# Patient Record
Sex: Male | Born: 1980 | Race: Black or African American | Hispanic: No | Smoking: Current every day smoker
Health system: Southern US, Community
[De-identification: ages and names within clinical notes are randomized; demographics above are authoritative.]

---
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
## 2000-04-16 ENCOUNTER — Emergency Department (HOSPITAL_COMMUNITY): Admission: EM | Admit: 2000-04-16 | Discharge: 2000-04-16 | Payer: Self-pay | Admitting: Emergency Medicine

## 2000-04-16 ENCOUNTER — Encounter: Payer: Self-pay | Admitting: Emergency Medicine

## 2004-09-07 ENCOUNTER — Emergency Department (HOSPITAL_COMMUNITY): Admission: EM | Admit: 2004-09-07 | Discharge: 2004-09-08 | Payer: Self-pay | Admitting: Emergency Medicine

## 2004-12-16 ENCOUNTER — Emergency Department: Payer: Self-pay | Admitting: Emergency Medicine

## 2005-04-18 ENCOUNTER — Ambulatory Visit (HOSPITAL_BASED_OUTPATIENT_CLINIC_OR_DEPARTMENT_OTHER): Admission: RE | Admit: 2005-04-18 | Discharge: 2005-04-18 | Payer: Self-pay | Admitting: Orthopedic Surgery

## 2006-04-29 ENCOUNTER — Emergency Department (HOSPITAL_COMMUNITY): Admission: EM | Admit: 2006-04-29 | Discharge: 2006-04-29 | Payer: Self-pay | Admitting: Emergency Medicine

## 2015-06-08 ENCOUNTER — Encounter (HOSPITAL_COMMUNITY): Payer: Self-pay | Admitting: *Deleted

## 2015-06-08 ENCOUNTER — Emergency Department (HOSPITAL_COMMUNITY): Payer: Self-pay

## 2015-06-08 ENCOUNTER — Emergency Department (HOSPITAL_COMMUNITY)
Admission: EM | Admit: 2015-06-08 | Discharge: 2015-06-08 | Disposition: A | Discharge status: Home / Self Care | Payer: Self-pay | Attending: Emergency Medicine | Admitting: Emergency Medicine

## 2015-06-08 DIAGNOSIS — S43004A Unspecified dislocation of right shoulder joint, initial encounter: Secondary | ICD-10-CM

## 2015-06-08 DIAGNOSIS — Y998 Other external cause status: Secondary | ICD-10-CM | POA: Insufficient documentation

## 2015-06-08 DIAGNOSIS — F17200 Nicotine dependence, unspecified, uncomplicated: Secondary | ICD-10-CM | POA: Insufficient documentation

## 2015-06-08 DIAGNOSIS — Y9389 Activity, other specified: Secondary | ICD-10-CM | POA: Insufficient documentation

## 2015-06-08 DIAGNOSIS — X58XXXA Exposure to other specified factors, initial encounter: Secondary | ICD-10-CM | POA: Insufficient documentation

## 2015-06-08 DIAGNOSIS — Y9289 Other specified places as the place of occurrence of the external cause: Secondary | ICD-10-CM | POA: Insufficient documentation

## 2015-06-08 DIAGNOSIS — S43014A Anterior dislocation of right humerus, initial encounter: Secondary | ICD-10-CM | POA: Insufficient documentation

## 2015-06-08 MED ORDER — FLUMAZENIL 0.5 MG/5ML IV SOLN
INTRAVENOUS | Status: AC
  Filled 2015-06-08: qty 5

## 2015-06-08 MED ORDER — CYCLOBENZAPRINE HCL 10 MG PO TABS: 10.0000 mg | ORAL_TABLET | Freq: Two times a day (BID) | ORAL | Status: AC | PRN

## 2015-06-08 MED ORDER — PROPOFOL 10 MG/ML IV BOLUS
100.0000 mg | Freq: Once | INTRAVENOUS | Status: AC
  Administered 2015-06-08: 100 mg via INTRAVENOUS

## 2015-06-08 MED ORDER — SODIUM CHLORIDE 0.9 % IV BOLUS (SEPSIS)
1000.0000 mL | Freq: Once | INTRAVENOUS | Status: AC
  Administered 2015-06-08: 1000 mL via INTRAVENOUS

## 2015-06-08 MED ORDER — PROPOFOL 10 MG/ML IV BOLUS
1.0000 mg/kg | Freq: Once | INTRAVENOUS | Status: DC
  Filled 2015-06-08: qty 20

## 2015-06-08 MED ORDER — FENTANYL CITRATE (PF) 100 MCG/2ML IJ SOLN
100.0000 ug | Freq: Once | INTRAMUSCULAR | Status: AC
Start: 2015-06-08 — End: 2015-06-08
  Administered 2015-06-08: 100 ug via INTRAVENOUS
  Filled 2015-06-08: qty 2

## 2015-06-08 MED ORDER — NALOXONE HCL 0.4 MG/ML IJ SOLN
INTRAMUSCULAR | Status: AC
  Filled 2015-06-08: qty 1

## 2015-06-08 MED ORDER — IBUPROFEN 600 MG PO TABS: 600.0000 mg | ORAL_TABLET | Freq: Four times a day (QID) | ORAL | Status: AC | PRN

## 2015-06-08 NOTE — ED Notes (Signed)
Pt here with c/o right shoulder dislocation that happened this am , history of same

## 2015-06-08 NOTE — ED Notes (Signed)
Paged Ortho  

## 2015-06-08 NOTE — ED Provider Notes (Signed)
CSN: [id number]     Arrival date & time 06/08/15  4098 History   First MD Initiated Contact with Patient 06/08/15 0920     Chief Complaint  Patient presents with  . Shoulder Injury     (Consider location/radiation/quality/duration/timing/severity/associated sxs/prior Treatment) Patient is a 35 y.o. male presenting with shoulder injury.  Shoulder Injury This is a new problem. The problem occurs constantly. The problem has not changed since onset.Pertinent negatives include no chest pain, no abdominal pain, no headaches and no shortness of breath. Exacerbated by: worse with moving. Nothing relieves the symptoms. He has tried nothing for the symptoms.  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 Was throwing things around house 1hr ago and it popped out. Pain now 7/10. No other injuries.  History reviewed. No pertinent past medical history. History reviewed. No pertinent past surgical history. History reviewed. No pertinent family history. Social History  Substance Use Topics  . Smoking status: Current Every Day Smoker  . Smokeless tobacco: None  . Alcohol Use: Yes    Review of Systems  Constitutional: Negative for fever.  HENT: Negative for sore throat.   Eyes: Negative for visual disturbance.  Respiratory: Negative for shortness of breath.   Cardiovascular: Negative for chest pain.  Gastrointestinal: Negative for abdominal pain.  Genitourinary: Negative for difficulty urinating.  Musculoskeletal: Positive for arthralgias. Negative for back pain and neck stiffness.  Skin: Negative for rash.  Neurological: Negative for syncope and headaches.      Allergies  Review of patient's allergies indicates no known allergies.  Home Medications   Prior to Admission medications   Medication Sig Start Date End Date Taking? Authorizing Provider  cyclobenzaprine (FLEXERIL) 10 MG tablet Take 1 tablet (10 mg total) by mouth 2 (two) times daily as needed for muscle spasms. 06/08/15    Alvira Monday, MD  ibuprofen (ADVIL,MOTRIN) 600 MG tablet Take 1 tablet (600 mg total) by mouth every 6 (six) hours as needed. 06/08/15   Alvira Monday, MD   BP 132/89 mmHg  Pulse 66  Temp(Src) 98 F (36.7 C) (Oral)  Resp 20  Ht  (1.702 m)  Wt 150 lb (68.04 kg)  BMI 23.49 kg/m2  SpO2 98% Physical Exam  Constitutional: He is oriented to person, place, and time. He appears well-developed and well-nourished. No distress.  HENT:  Head: Normocephalic and atraumatic.  Eyes: Conjunctivae and EOM are normal.  Neck: Normal range of motion.  Cardiovascular: Normal rate, regular rhythm, normal heart sounds and intact distal pulses.  Exam reveals no gallop and no friction rub.   No murmur heard. Pulmonary/Chest: Effort normal and breath sounds normal. No respiratory distress. He has no wheezes. He has no rales.  Abdominal: Soft. He exhibits no distension. There is no tenderness. There is no guarding.  Musculoskeletal: He exhibits no edema.       Right shoulder: He exhibits decreased range of motion, tenderness, bony tenderness and deformity.  Neurological: He is alert and oriented to person, place, and time.  Skin: Skin is warm and dry. He is not diaphoretic.  Nursing note and vitals reviewed.   ED Course  .Sedation Date/Time: 06/08/2015 6:06 PM Performed by: Alvira Monday Authorized by: Alvira Monday  Consent:    Consent obtained:  Written and verbal   Consent given by:  Patient   Risks discussed:  Allergic reaction, dysrhythmia, inadequate sedation, nausea, vomiting, respiratory compromise necessitating ventilatory assistance and intubation, prolonged sedation necessitating reversal and prolonged hypoxia resulting in organ damage  Alternatives discussed:  Analgesia without sedation, anxiolysis and regional anesthesia Indications:    Sedation purpose:  Dislocation reduction   Procedure necessitating sedation performed by:  Physician performing sedation   Intended level of  sedation:  Moderate (conscious sedation) Pre-sedation assessment:    ASA classification: class 1 - normal, healthy patient     Neck mobility: normal     Mallampati score:  I - soft palate, uvula, fauces, pillars visible   Pre-sedation assessments completed and reviewed: airway patency, cardiovascular function, hydration status, mental status, pain level, respiratory function and temperature   Immediate pre-procedure details:    Reassessment: Patient reassessed immediately prior to procedure     Reviewed: vital signs     Verified: bag valve mask available, emergency equipment available, intubation equipment available, IV patency confirmed, oxygen available and suction available   Procedure details (see MAR for exact dosages):    Sedation start time:  06/08/2015 10:42 AM   Preoxygenation:  Nasal cannula   Sedation:  Propofol   Analgesia:  Fentanyl   Intra-procedure monitoring:  Cardiac monitor, blood pressure monitoring, continuous capnometry, continuous pulse oximetry, frequent LOC assessments and frequent vital sign checks   Intra-procedure events: none     Sedation end time:  06/08/2015 10:52 AM   Total sedation time (minutes):  10 Post-procedure details:    Post-sedation assessment completed:  06/08/2015 11:10 AM   Attendance: Constant attendance by certified staff until patient recovered     Recovery: Patient returned to pre-procedure baseline     Post-sedation assessments completed and reviewed: airway patency, cardiovascular function, hydration status, mental status, nausea/vomiting, pain level and respiratory function     Patient is stable for discharge or admission: Yes     Patient tolerance:  Tolerated well, no immediate complications  (including critical care time) Labs Review Labs Reviewed - No data to display  Imaging Review Dg Shoulder Right  06/08/2015  CLINICAL DATA:  History of shoulder dislocation EXAM: RIGHT SHOULDER - 2+ VIEW COMPARISON:  12/16/2004 FINDINGS: There is  complete anterior dislocation of the humeral head with respect to the glenoid. No fracture. Hill-Sachs deformity. IMPRESSION: Anterior glenohumeral dislocation. Electronically Signed   By: Jolaine ClickArthur  Hoss M.D.   On: 06/08/2015 10:07   Dg Shoulder Right Port  06/08/2015  CLINICAL DATA:  Reduction of shoulder dislocation. EXAM: PORTABLE RIGHT SHOULDER - 2+ VIEW COMPARISON:  Earlier films, same date. FINDINGS: The humeral head has been relocated in the glenoid fossa. No acute fractures identified. 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 IMPRESSION: Reduction of right shoulder dislocation. 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 Electronically Signed   By: Rudie MeyerP.  Gallerani M.D.   On: 06/08/2015 11:49   I have personally reviewed and evaluated these images and lab results as part of my medical decision-making.   EKG Interpretation None      MDM   Final diagnoses:  Shoulder dislocation, right, initial encounter   [age] old male with a history of multiple shoulder dislocations presents with concern for right shoulder dislocation. Patient is neurovascularly intact.  Shoulder was reduced using propofol sedation. Patient neurovascularly intact following reduction. He is placed in a shoulder immobilizer, and recommended a follow-up with orthopedics as an outpatient. Is given a prescription for Flexeril and ibuprofen. Patient discharged in stable condition with understanding of reasons to return.   Alvira MondayErin Antion Andres, MD 06/08/15 [phone]

## 2015-06-08 NOTE — Discharge Instructions (Signed)
Shoulder Dislocation °A shoulder dislocation happens when the upper arm bone (humerus) moves out of the shoulder joint. The shoulder joint is the part of the shoulder where the humerus, shoulder blade (scapula), and collarbone (clavicle) meet. °CAUSES °This condition is often caused by: °· A fall. °· A hit to the shoulder. °· A forceful movement of the shoulder. °RISK FACTORS °This condition is more likely to develop in people who play sports. °SYMPTOMS °Symptoms of this condition include: °· Deformity of the shoulder. °· Intense pain. °· Inability to move the shoulder. °· Numbness, weakness, or tingling in your neck or down your arm. °· Bruising or swelling around your shoulder. °DIAGNOSIS °This condition is diagnosed with a physical exam. After the exam, tests may be done to check for related problems. Tests that may be done include: °· X-ray. This may be done to check for broken bones. °· MRI. This may be done to check for damage to the tissues around the shoulder. °· Electromyogram. This may be done to check for nerve damage. °TREATMENT °This condition is treated with a procedure to place the humerus back in the joint. This procedure is called a reduction. There are two types of reduction: °· Closed reduction. In this procedure, the humerus is placed back in the joint without surgery. The health care provider uses his or her hands to guide the bone back into place. °· Open reduction. In this procedure, the humerus is placed back in the joint with surgery. An open reduction may be recommended if: °¨ You have a weak shoulder joint or weak ligaments. °¨ You have had more than one shoulder dislocation. °¨ The nerves or blood vessels around your shoulder have been damaged. °After the humerus is placed back into the joint, your arm will be placed in a splint or sling to prevent it from moving. You will need to wear the splint or sling until your shoulder heals. When the splint or sling is removed, you may have  physical therapy to help improve the range of motion in your shoulder joint. °HOME CARE INSTRUCTIONS °If You Have a Splint or Sling: °· Wear it as told by your health care provider. Remove it only as told by your health care provider. °· Loosen it if your fingers become numb and tingle, or if they turn cold and blue. °· Keep it clean and dry. °Bathing °· Do not take baths, swim, or use a hot tub until your health care provider approves. Ask your health care provider if you can take showers. You may only be allowed to take sponge baths for bathing. °· If your health care provider approves bathing and showering, cover your splint or sling with a watertight plastic bag to protect it from water. Do not let the splint or sling get wet. °Managing Pain, Stiffness, and Swelling °· If directed, apply ice to the injured area. °¨ Put ice in a plastic bag. °¨ Place a towel between your skin and the bag. °¨ Leave the ice on for 20 minutes, 2-3 times per day. °· Move your fingers often to avoid stiffness and to decrease swelling. °· Raise (elevate) the injured area above the level of your heart while you are sitting or lying down. °Driving °· Do not drive while wearing a splint or sling on a hand that you use for driving. °· Do not drive or operate heavy machinery while taking pain medicine. °Activity °· Return to your normal activities as told by your health care provider. Ask your   health care provider what activities are safe for you.  Perform range-of-motion exercises only as told by your health care provider.  Exercise your hand by squeezing a soft ball. This helps to decrease stiffness and swelling in your hand and wrist. General Instructions  Take over-the-counter and prescription medicines only as told by your health care provider.  Do not use any tobacco products, including cigarettes, chewing tobacco, or e-cigarettes. Tobacco can delay bone and tissue healing. If you need help quitting, ask your health care  provider.  Keep all follow-up visits as told by your health care provider. This is important. SEEK MEDICAL CARE IF:  Your splint or sling gets damaged. SEEK IMMEDIATE MEDICAL CARE IF: How to Use a Shoulder Immobilizer A shoulder immobilizer is a device that you may have to wear after a shoulder injury or surgery. This device keeps your arm from moving. This prevents additional pain or injury. It also supports your arm next to your body as your shoulder heals. You may need to wear a shoulder immobilizer to treat a broken bone (fracture) in your shoulder. You may also need to wear one if you have an injury that moves your shoulder out of position (dislocation). There are different types of shoulder immobilizers. The one that you get depends on your injury. RISKS AND COMPLICATIONS Wearing a shoulder immobilizer in the wrong way can let your injured shoulder move around too much. This may delay healing and make your pain and swelling worse. HOW TO USE YOUR SHOULDER IMMOBILIZER  The part of the immobilizer that goes around your neck (sling) should support your upper arm, with your elbow bent and your lower arm and hand across your chest.  Make sure that your elbow:  Is snug against the back pocket of the sling.  Does not move away from your body.  The strap of the immobilizer should go over your shoulder and support your arm and hand. Your hand should be slightly higher than your elbow. It should not hang loosely over the edge of the sling.  If the long strap has a pad, place it where it is most comfortable on your neck.  Carefully follow your health care provider's instructions for wearing your shoulder immobilizer. Your health care provider may want you to:  Loosen your immobilizer to straighten your elbow and move your wrist and fingers. You may have to do this several times each day. Ask your health care provider when you should do this and how often.  Remove your immobilizer once every  day to shower, but limit the movement in your injured arm. Before putting the immobilizer back on, use a towel to dry the area under your arm completely.  Remove your immobilizer to do shoulder exercises at home as directed by your health care provider.  Wear your immobilizer while you sleep. You may sleep more comfortably if you have your upper body raised on pillows. SEEK MEDICAL CARE IF:  Your immobilizer is not supporting your arm properly.  Your immobilizer gets damaged.  You have worsening pain or swelling in your shoulder, arm, or hand.  Your shoulder, arm, or hand changes color or temperature.  You lose feeling in your shoulder, arm, or hand.   This information is not intended to replace advice given to you by your health care provider. Make sure you discuss any questions you have with your health care provider.   Document Released: 03/03/2004 Document Revised: 06/10/2014 Document Reviewed: 01/01/2014 [employer] Interactive Patient Education [employer]  Your pain gets worse rather than better.  You lose feeling in your arm or hand.  Your arm or hand becomes white and cold.   This information is not intended to replace advice given to you by your health care provider. Make sure you discuss any questions you have with your health care provider.   Document Released: 10/19/2000 Document Revised: 10/15/2014 Document Reviewed: 05/19/2014 [employer] Interactive Patient Education [employer]

## 2015-06-08 NOTE — ED Notes (Signed)
Patient, Nurse, and Doctor signed informed consent documentation

## 2015-07-25 ENCOUNTER — Encounter (HOSPITAL_COMMUNITY): Payer: Self-pay

## 2015-07-25 ENCOUNTER — Emergency Department (HOSPITAL_COMMUNITY): Payer: Self-pay

## 2015-07-25 ENCOUNTER — Emergency Department (HOSPITAL_COMMUNITY)
Admission: EM | Admit: 2015-07-25 | Discharge: 2015-07-25 | Disposition: A | Discharge status: Home / Self Care | Payer: Self-pay | Attending: Emergency Medicine | Admitting: Emergency Medicine

## 2015-07-25 DIAGNOSIS — S43141A Inferior dislocation of right acromioclavicular joint, initial encounter: Secondary | ICD-10-CM | POA: Insufficient documentation

## 2015-07-25 DIAGNOSIS — F17200 Nicotine dependence, unspecified, uncomplicated: Secondary | ICD-10-CM | POA: Insufficient documentation

## 2015-07-25 DIAGNOSIS — W16512A Jumping or diving into swimming pool striking water surface causing other injury, initial encounter: Secondary | ICD-10-CM | POA: Insufficient documentation

## 2015-07-25 DIAGNOSIS — Y999 Unspecified external cause status: Secondary | ICD-10-CM | POA: Insufficient documentation

## 2015-07-25 DIAGNOSIS — Y9289 Other specified places as the place of occurrence of the external cause: Secondary | ICD-10-CM | POA: Insufficient documentation

## 2015-07-25 DIAGNOSIS — Y9312 Activity, springboard and platform diving: Secondary | ICD-10-CM | POA: Insufficient documentation

## 2015-07-25 DIAGNOSIS — S43004A Unspecified dislocation of right shoulder joint, initial encounter: Secondary | ICD-10-CM

## 2015-07-25 MED ORDER — LIDOCAINE HCL (PF) 1 % IJ SOLN
20.0000 mL | Freq: Once | INTRAMUSCULAR | Status: AC
  Administered 2015-07-25: 20 mL
  Filled 2015-07-25: qty 20

## 2015-07-25 MED ORDER — FENTANYL CITRATE (PF) 100 MCG/2ML IJ SOLN
50.0000 ug | Freq: Once | INTRAMUSCULAR | Status: AC
  Administered 2015-07-25: 50 ug via INTRAVENOUS
  Filled 2015-07-25: qty 2

## 2015-07-25 MED ORDER — FENTANYL CITRATE (PF) 100 MCG/2ML IJ SOLN
100.0000 ug | Freq: Once | INTRAMUSCULAR | Status: AC
  Administered 2015-07-25: 100 ug via INTRAVENOUS
  Filled 2015-07-25: qty 2

## 2015-07-25 NOTE — Discharge Instructions (Signed)
Shoulder Dislocation °A shoulder dislocation happens when the upper arm bone (humerus) moves out of the shoulder joint. The shoulder joint is the part of the shoulder where the humerus, shoulder blade (scapula), and collarbone (clavicle) meet. °CAUSES °This condition is often caused by: °· A fall. °· A hit to the shoulder. °· A forceful movement of the shoulder. °RISK FACTORS °This condition is more likely to develop in people who play sports. °SYMPTOMS °Symptoms of this condition include: °· Deformity of the shoulder. °· Intense pain. °· Inability to move the shoulder. °· Numbness, weakness, or tingling in your neck or down your arm. °· Bruising or swelling around your shoulder. °DIAGNOSIS °This condition is diagnosed with a physical exam. After the exam, tests may be done to check for related problems. Tests that may be done include: °· X-ray. This may be done to check for broken bones. °· MRI. This may be done to check for damage to the tissues around the shoulder. °· Electromyogram. This may be done to check for nerve damage. °TREATMENT °This condition is treated with a procedure to place the humerus back in the joint. This procedure is called a reduction. There are two types of reduction: °· Closed reduction. In this procedure, the humerus is placed back in the joint without surgery. The health care provider uses his or her hands to guide the bone back into place. °· Open reduction. In this procedure, the humerus is placed back in the joint with surgery. An open reduction may be recommended if: °¨ You have a weak shoulder joint or weak ligaments. °¨ You have had more than one shoulder dislocation. °¨ The nerves or blood vessels around your shoulder have been damaged. °After the humerus is placed back into the joint, your arm will be placed in a splint or sling to prevent it from moving. You will need to wear the splint or sling until your shoulder heals. When the splint or sling is removed, you may have  physical therapy to help improve the range of motion in your shoulder joint. °HOME CARE INSTRUCTIONS °If You Have a Splint or Sling: °· Wear it as told by your health care provider. Remove it only as told by your health care provider. °· Loosen it if your fingers become numb and tingle, or if they turn cold and blue. °· Keep it clean and dry. °Bathing °· Do not take baths, swim, or use a hot tub until your health care provider approves. Ask your health care provider if you can take showers. You may only be allowed to take sponge baths for bathing. °· If your health care provider approves bathing and showering, cover your splint or sling with a watertight plastic bag to protect it from water. Do not let the splint or sling get wet. °Managing Pain, Stiffness, and Swelling °· If directed, apply ice to the injured area. °¨ Put ice in a plastic bag. °¨ Place a towel between your skin and the bag. °¨ Leave the ice on for 20 minutes, 2-3 times per day. °· Move your fingers often to avoid stiffness and to decrease swelling. °· Raise (elevate) the injured area above the level of your heart while you are sitting or lying down. °Driving °· Do not drive while wearing a splint or sling on a hand that you use for driving. °· Do not drive or operate heavy machinery while taking pain medicine. °Activity °· Return to your normal activities as told by your health care provider. Ask your   health care provider what activities are safe for you.  Perform range-of-motion exercises only as told by your health care provider.  Exercise your hand by squeezing a soft ball. This helps to decrease stiffness and swelling in your hand and wrist. General Instructions  Take over-the-counter and prescription medicines only as told by your health care provider.  Do not use any tobacco products, including cigarettes, chewing tobacco, or e-cigarettes. Tobacco can delay bone and tissue healing. If you need help quitting, ask your health care  provider.  Keep all follow-up visits as told by your health care provider. This is important. SEEK MEDICAL CARE IF:  Your splint or sling gets damaged. SEEK IMMEDIATE MEDICAL CARE IF:  Your pain gets worse rather than better.  You lose feeling in your arm or hand.  Your arm or hand becomes white and cold.   This information is not intended to replace advice given to you by your health care provider. Make sure you discuss any questions you have with your health care provider.   Document Released: 10/19/2000 Document Revised: 10/15/2014 Document Reviewed: 05/19/2014 [employer] Interactive Patient Education 2016 [employer]  Multidirectional Shoulder Instability With Rehab Anterior shoulder instability is a condition that is characterized by recurrent dislocation or partial dislocation (subluxation) of the shoulder joint. Dislocation is an injury in which two adjacent bones are no longer in proper alignment, and the joint surfaces are no longer touching. Subluxation is a similar injury to dislocation; however, the joint surfaces are still touching. With multidirectional shoulder instability, dislocations and subluxations of the shoulder joint (glenohumeral) involve the upper arm bone (humerus) displacing forward (anteriorly), downward (inferiorly), or backward (posteriorly). The shoulder joint allows more motion than any other joint in the body, and because of this it is highly susceptible to injury. When the glenohumeral joint is dislocated or subluxated, the muscles that control the shoulder joint (rotator cuff) tendons become stretched. Repetitive injury results in the shoulder joint becoming loose and results in instability of the shoulder joint. These injuries may also cause a tear in the labrum (cartilaginous rim) that lines the joint and helps keep the humerus head in place. SYMPTOMS   Severe shoulder pain when the joint is dislocated or subluxated.  Shoulder weakness, pain, and/or  inflammation.  Loss of shoulder function.  Pain that worsens with shoulder function, especially motions that involve arm movements above shoulder height.  Feeling of shoulder weakness or instability.  Signs of nerve damage: numbness or paralysis.  Crackling (crepitation) feeling and sound when the injured area is touched or with shoulder motion.  Often occurs in both shoulders. CAUSES  Multidirectional shoulder instability is caused by injury to the glenohumeral joint that causes it to become dislocated or subluxated. Common mechanisms of injury include:  Microtraumatic or atraumatic (most common).  Direct trauma to the shoulder joint.  Repetitive and/or strenuous movements of the shoulder joint, especially those with the arm above shoulder height.  Sprain of on the ligaments of the shoulder joint.  A shallow or malformed joint surface you are born with (congenital). RISK INCREASES WITH:  Contact sports (football, wrestling, and basketball).  Activities that involve repetitive and/or strenuous movements of the shoulder joint, especially those with the arm above shoulder height (baseball, volleyball, or swimming).  Previous shoulder injury.  Poor strength and flexibility.  Congenital abnormality (shallow or malformed joint surface). PREVENTION   Warm up and stretch properly before activity.  Allow for adequate recovery between workouts.  Maintain physical fitness:  Strength, flexibility, and  endurance.  Cardiovascular fitness.  Learn and use proper technique. When possible, have coach correct improper technique.  Wear properly fitted and padded protective equipment. PROGNOSIS The extent of recovery and likelihood of future dislocations and subluxations depends on the extent of damage done to the shoulder. Reoccurrence of symptoms is likely for individuals with multidirectional shoulder instability. RELATED COMPLICATIONS   Damage to the nervous system or blood  vessels that may cause weakness, paralysis, numbness, coldness, and paleness.  Damage to the bones or cartilage of the shoulder joint.  Permanent shoulder instability.  Tear of one or more of the rotator cuff tendons.  Arthritis of the shoulder. TREATMENT  When the shoulder joint is dislocated it must be reduced (the bones realigned) by someone who is trained in the procedure. Occasionally reduction cannot be performed manually, and requires surgery. After reduction, the use of ice and medication may help reduce pain and inflammation. The shoulder should be immobilized with a sling for 3 to 8 weeks to allow the joint to heal. After immobilization it is important to perform strengthening and stretching exercises to help regain strength and a full range of motion. These exercises may be completed at home or with a therapist. Surgery is reserved for individuals who have sustained multiple shoulder dislocations due to shoulder instability. MEDICATION   General anesthesia or muscle relaxants may be necessary for reduction of the shoulder joint.  If pain medication is necessary, then nonsteroidal anti-inflammatory medications, such as aspirin and ibuprofen, or other minor pain relievers, such as acetaminophen, are often recommended.  Do not take pain medication for 7 days before surgery.  Prescription pain relievers may be given if deemed necessary by your caregiver. Use only as directed and only as much as you need. COLD THERAPY  Cold treatment (icing) relieves pain and reduces inflammation. Cold treatment should be applied for 10 to 15 minutes every 2 to 3 hours for inflammation and pain and immediately after any activity that aggravates your symptoms. Use ice packs or massage the area with a piece of ice (ice massage). SEEK MEDICAL CARE IF:  Treatment seems to offer no benefit, or the condition worsens.  Any medications produce adverse side effects.  Any complications from surgery  occur:  Pain, numbness, or coldness in the extremity operated upon.  Discoloration of the nail beds (they become blue or gray) of the extremity operated upon.  Signs of infections (fever, pain, inflammation, redness, or persistent bleeding). EXERCISES RANGE OF MOTION (ROM) AND STRETCHING EXERCISES - Shoulder Instability, Multidirectional These exercises may help you restore your shoulder mobility once your physician has discontinued your 3-8 week immobilization period. The length of your immobilization depends on the intensity of your injury and the quality of the tissues before they were repaired. While completing these exercises, remember:   Restoring tissue flexibility helps normal motion to return to the joints. This allows healthier, less painful movement and activity.  An effective stretch should be held for at least 30 seconds.  A stretch should never be painful. You should only feel a gentle lengthening or release in the stretched tissue. During your recovery, avoid activity or exercises which involve actions that place your right / left hand or elbow above your head or behind your back or head. These positions stress the tissues which are trying to heal. ROM - Pendulum  Bend at the waist so that your right / left arm falls away from your body. Support yourself with your opposite hand on a solid surface, such  as a table or a countertop.  Your right / left arm should be perpendicular to the ground. If it is not perpendicular, you need to lean over farther. Relax the muscles in your right / leftarm and shoulder as much as possible.  Gently sway your hips and trunk so they move your right / leftarm without any use of your right / left shoulder muscles.  Progress your movements so that your right / left arm moves side to side, then forward and backward, and finally, both clockwise and counterclockwise.  Complete __________ repetitions in each direction. Many people use this exercise to  relieve discomfort in their shoulder as well as to gain range of motion. Repeat __________ times. Complete this exercise __________ times per day. ROM - Flexion, Active-Assisted  Lie on your back. You may bend your knees for comfort.  Grasp a broomstick or cane so your hands are about shoulder-width apart. Your right / left hand should grip the end of the stick/cane so that your hand is positioned "thumbs-up," as if you were about to shake hands.  Using your healthy arm to lead, raise your right / left arm overhead until you feel a gentle stretch in your shoulder. Hold __________ seconds.  Use the stick/cane to assist in returning your right / left arm to its starting position. Repeat __________ times. Complete this exercise __________ times per day.  ROM - Internal Rotation, Supine  Lie on your back on a firm surface. Place your right / left elbow about 60 degrees away from your side. Elevate your elbow with a folded towel so that the elbow and shoulder are the same height.  Using a broomstick or cane and your strong arm, pull your right / left hand toward your body until you feel a gentle stretch, but no increase in your shoulder pain. Keep your shoulder and elbow in place throughout the exercise.  Hold __________. Slowly return to the starting position. Repeat __________ times. Complete this exercise __________ times per day. STRETCH - External Rotation  Tuck a folded towel or small ball under your right / left upper arm. Grasp a broomstick or cane with an underhand grasp a little more than shoulder width apart. Bend your elbows to 90 degrees.  Stand with good posture or sit in a firm chair without arms.  Use your strong arm to push the stick across your body. Do not allow the towel or ball to fall. This will rotate your right / left arm away from your abdomen. Using the stick turn/rotate your hand and forearm away from your body. Hold __________ seconds. Repeat __________ times.  Complete this exercise __________ times per day.  STRETCH - Flexion, Seated  Sit in a firm chair so that your right / left forearm can rest on a table or on a table or countertop. Your right / left elbow should rest below the height of your shoulder so that your shoulder feels supported and not tense or uncomfortable.  Keeping your right / left shoulder relaxed, lean forward at your waist, allowing your right / left hand to slide forward. Bend forward until you feel a moderate stretch in your shoulder, but before you feel an increase in your pain.  Hold __________ seconds. Slowly return to your starting position. Repeat __________ times. Complete this exercise __________ times per day.  STRETCH - Flexion, Standing  Stand facing a wall. Walk your right / left fingers up the wall until you feel a moderate stretch in your shoulder.  As your hand gets higher, you may need to step closer to the wall or use a door frame to walk through.  Try to avoid shrugging your right / left shoulder as your arm rises by keeping your shoulder blade tucked down and toward your mid-back spine.  Hold __________ seconds. Use your other hand, if needed, to ease out of the stretch and return to the starting position. Repeat __________ times. Complete this exercise __________ times per day.  STRENGTHENING EXERCISES - Shoulder Instability, Multidirectional These exercises will help you begin to restore your shoulder strength once your physician has discontinued your 3-8 week immobilization period. The length of your immobilization depends on the intensity of your injury and the quality of the tissues before they were repaired. While completing these exercises, remember:   Muscles can gain both the endurance and the strength needed for everyday activities through controlled exercises.  Complete these exercises as instructed by your physician, physical therapist or athletic trainer. Progress with the resistance and  repetition exercises only as your caregiver advises.  You may experience muscle soreness or fatigue, but the pain or discomfort you are trying to eliminate should never worsen during these exercises. If this pain does worsen, stop and make certain you are following the directions exactly. If the pain is still present after adjustments, discontinue the exercise until you can discuss the trouble with your clinician.  During your recovery, avoid activity or exercises which involve actions that place your right / left hand or elbow above your head or behind your back or head. These positions stress the tissues which are trying to heal. STRENGTH - Scapular Depression and Adduction  With good posture, sit on a firm chair. Supported your arms in front of you with pillows, arm rests or a table top. Have your elbows in line with the sides of your body.  Gently draw your shoulder blades down and toward your mid-back spine. Gradually increase the tension without tensing the muscles along the top of your shoulders and the back of your neck.  Hold for __________ seconds. Slowly release the tension and relax your muscles completely before completing the next repetition.  After you have practiced this exercise, remove the arm support and complete it in standing as well as sitting. Repeat __________ times. Complete this exercise __________ times per day.  STRENGTH - Shoulder Flexion, Isometric  With good posture and facing a wall, stand or sit about 4-6 inches away.  Keeping your right / left elbow straight, gently press the top of your fist into the wall. Increase the pressure gradually until you are pressing as hard as you can without shrugging your shoulder or increasing any shoulder discomfort.  Hold __________ seconds.  Release the tension slowly. Relax your shoulder muscles completely before you do the next repetition. Repeat __________ times. Complete this exercise __________ times per day.  STRENGTH  - Shoulder Abductors, Isometric  With good posture, stand or sit about 4-6 inches from a wall with your right / left side facing the wall.  Bend your right / left elbow. Gently press your right / left elbow into the wall. Increase the pressure gradually until you are pressing as hard as you can without shrugging your shoulder or increasing any shoulder discomfort.  Hold __________ seconds.  Release the tension slowly. Relax your shoulder muscles completely before you do the next repetition. Repeat __________ times. Complete this exercise __________ times per day.  STRENGTH - Internal Rotators, Isometric  Keep your right /  left elbow at your side and bend it 90 degrees.  Step into a door frame so that the inside of your right / left wrist can press against the door frame without your upper arm leaving your side.  Gently press your right / left wrist into the door frame as if you were trying to draw the palm of your hand to your abdomen. Gradually increase the tension until you are pressing as hard as you can without shrugging your shoulder or increasing any shoulder discomfort.  Hold __________ seconds.  Release the tension slowly. Relax your shoulder muscles completely before you do the next repetition. Repeat __________ times. Complete this exercise __________ times per day.  STRENGTH - External Rotators, Isometric   Keep your right / left elbow at your side and bend it 90 degrees.  Step into a door frame so that the outside of your right / left wrist can press against the door frame without your upper arm leaving your side.  Gently press your right / left wrist into the door frame as if you were trying to swing the back of your hand away from your abdomen. Gradually increase the tension until you are pressing as hard as you can without shrugging your shoulder or increasing any shoulder discomfort.  Hold __________ seconds.  Release the tension slowly. Relax your shoulder muscles  completely before you do the next repetition. Repeat __________ times. Complete this exercise __________ times per day.  STRENGTH - Shoulder Extensors   Secure a rubber exercise band/tubing so that it is at the height of your shoulders when you are either standing or sitting on a firm arm-less chair.  With a thumbs-up grip, grasp an end of the band/tubing in each hand. Straighten your elbows and lift your hands straight in front of you at shoulder height. Step back away from the secured end of band/tubing until it becomes tense.  Squeezing your shoulder blades together, pull your hands down to the sides of your thighs. Do not allow your hands to go behind you.  Hold for __________ seconds. Slowly ease the tension on the band/tubing as you reverse the directions and return to the starting position. Repeat __________ times. Complete this exercise __________ times per day.  STRENGTH - Internal Rotators  Secure a rubber exercise band/tubing to a fixed object so that it is at the same height as your right / left elbow when you are standing or sitting on a firm surface.  Stand or sit so that the secured exercise band/tubing is at your right / left side.  Bend your elbow 90 degrees. Place a folded towel or small pillow under your right / left arm so that your elbow is a few inches away from your side.  Keeping the tension on the exercise band/tubing, pull it across your body toward your abdomen. Be sure to keep your body steady so that the movement is only coming from your shoulder rotating.  Hold __________ seconds. Release the tension in a controlled manner as you return to the starting position. Repeat __________ times. Complete this exercise __________ times per day.  STRENGTH - External Rotators  Secure a rubber exercise band/tubing to a fixed object so that it is at the same height as your right / left elbow when you are standing or sitting on a firm surface.  Stand or sit so that the  secured exercise band/tubing is at your side that is not injured.  Bend your elbow 90 degrees. Place a folded  towel or small pillow under your right / left arm so that your elbow is a few inches away from your side.  Keeping the tension on the exercise band/tubing, pull it away from your body, as if pivoting on your elbow. Be sure to keep your body steady so that the movement is only coming from your shoulder rotating.  Hold __________ seconds. Release the tension in a controlled manner as you return to the starting position. Repeat __________ times. Complete this exercise __________ times per day.  STRENGTH - Scapular Protractors, Standing  Stand arms-length away from a wall. Place your hands on the wall, keeping your elbows straight.  Begin by dropping your shoulder blades down and toward your mid-back spine.  To strengthen your protractors, keep your shoulder blades down, but slide them forward on your rib cage. It will feel as if you are lifting the back of your rib cage away from the wall. This is a subtle motion and can be challenging to complete. Ask your clinician for further instruction if you are not sure you are doing the exercise correctly.  Hold for __________ seconds. Slowly return to the starting position, resting the muscles completely before completing the next repetition. Repeat __________ times. Complete this exercise __________ times per day. STRENGTH - Shoulder Flexion  Stand or sit with good posture. Grasp a __________ weight or an exercise band/tubing so that your hand is "thumbs-up," like when you shake hands.  Slowly lift your right / left arm as far as you can without increasing any shoulder pain. Initially, many people can only raise their hand to shoulder height.  Avoid shrugging your right / left shoulder as your arm rises by keeping your shoulder blade tucked down and toward your mid-back spine.  Hold for __________ seconds. Control the descent of your hand as  you slowly return to your starting position. Repeat __________ times. Complete this exercise __________ times per day.  STRENGTH - Supraspinatus  Stand or sit with good posture. Grasp a __________ lb weight or an exercise band/tubing so that your hand is "thumbs-up," like when you shake hands.  Slowly lift your right / left hand from your thigh into the air, traveling about 30 degrees from straight out at your side. Lift your hand to shoulder height or as far as you can without increasing any shoulder pain. Initially, many people do not lift their hands above shoulder height.  Avoid shrugging your right / left shoulder as your arm rises by keeping your shoulder blade tucked down and toward your mid-back spine.  Hold for __________ seconds. Control the descent of your hand as you slowly return to your starting position. Repeat __________ times. Complete this exercise __________ times per day.  STRENGTH - Shoulder Abductors  Stand or sit with good posture. Place your right / left arm at your side.  With a thumbs-up grasp, hold a __________ weight or a secured rubber exercise band/tubing in your right / left hand. Slowly lift your arm from your side as far as you can without reproducing any of your shoulder pain. Do not lift your hand above shoulder-height unless you have been instructed to do so by your physician, physical therapist or athletic trainer. If this motion causes pain or increased discomfort, discuss this with your physician, physical therapist, or athletic trainer.  Avoid shrugging your right / left shoulder as your arm rises by keeping your shoulder blade tucked down and toward your mid-back spine.  Hold __________ seconds. Release the tension  in a controlled manner as you return to the starting position.  Repeat __________ times. Complete this exercise __________ times per day. STRENGTH - Horizontal Adductors  Secure a rubber exercise band/tubing so that it is at the height of  your shoulders when you are either standing or sitting on a firm arm-less chair.  Turn away from the secured band/tube so it is directly behind you. Grasp an end of the band/tubing in each hand and have your palms face each other. Step forward until the end of band/tubing until it becomes tense.  Keeping your arms at your sides, lift your elbows so they are 90 degrees from your body. Your arms should be slightly bent.  Keeping your arms elevated 90 degrees, draw your palms together.  Hold __________ seconds. Slowly ease the tension on the band/tubing as you reverse the directions and return to the starting position. Repeat __________ times. Complete this exercise __________ times per day. STRENGTH - Scapular Retractors and External Rotators  Secure a rubber exercise band/tubing so that it is at the height of your shoulders when you are either standing or sitting on a firm arm-less chair.  With a palm-down grip, grasp an end of the band/tubing in each hand. Bend your elbows 90 degrees and lift your elbows to shoulder height at your sides. Step back away from the secured end of band/tubing until it becomes tense.  Squeezing your shoulder blades together, rotate your shoulder so that your upper arm and elbow remain stationary, but your fists travel upward to head-height.  Hold for __________ seconds. Slowly ease the tension on the band/tubing as you reverse the directions and return to the starting position. Repeat __________ times. Complete this exercise __________ times per day.    This information is not intended to replace advice given to you by your health care provider. Make sure you discuss any questions you have with your health care provider.   Document Released: 01/24/2005 Document Revised: 10/15/2014 Document Reviewed: 05/08/2008 Elsevier Interactive Patient Education [employer]

## 2015-07-25 NOTE — ED Notes (Signed)
Patient Alert and oriented X4. Stable and ambulatory. Patient verbalized understanding of the discharge instructions.  Patient belongings were taken by the patient.  

## 2015-07-25 NOTE — ED Notes (Addendum)
Patient here with right shoulder pain after diving in pool this evening and waving arms around. 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

## 2015-07-25 NOTE — ED Provider Notes (Signed)
CSN: [id number]     Arrival date & time 07/25/15  1738 History   First MD Initiated Contact with Patient 07/25/15 1912     Chief Complaint  Patient presents with  . Shoulder Injury     (Consider location/radiation/quality/duration/timing/severity/associated sxs/prior Treatment) Patient is a 35 y.o. male presenting with shoulder injury. The history is provided by the patient.  Shoulder Injury This is a recurrent problem. The current episode started 1 to 2 hours ago. The problem occurs constantly. The problem has not changed since onset.Pertinent negatives include no chest pain. Exacerbated by: movement. Nothing relieves the symptoms. He has tried nothing for the symptoms.    History reviewed. No pertinent past medical history. History reviewed. No pertinent past surgical history. No family history on file. Social History  Substance Use Topics  . Smoking status: Current Every Day Smoker  . Smokeless tobacco: None  . Alcohol Use: Yes    Review of Systems  Cardiovascular: Negative for chest pain.  All other systems reviewed and are negative.     Allergies  Review of patient's allergies indicates no known allergies.  Home Medications   Prior to Admission medications   Medication Sig Start Date End Date Taking? Authorizing Provider  cyclobenzaprine (FLEXERIL) 10 MG tablet Take 1 tablet (10 mg total) by mouth 2 (two) times daily as needed for muscle spasms. 06/08/15   Alvira Monday, MD  ibuprofen (ADVIL,MOTRIN) 600 MG tablet Take 1 tablet (600 mg total) by mouth every 6 (six) hours as needed. 06/08/15   Alvira Monday, MD   BP 164/97 mmHg  Pulse 87  Temp(Src) 99 F (37.2 C)  Resp 12  Ht  (1.702 m)  Wt 150 lb (68.04 kg)  BMI 23.49 kg/m2  SpO2 99% Physical Exam  Constitutional: He is oriented to person, place, and time. He appears well-developed and well-nourished. No distress.  HENT:  Head: Normocephalic and atraumatic.  Eyes: Conjunctivae are normal.  Neck: Neck  supple. No tracheal deviation present.  Cardiovascular: Normal rate and regular rhythm.   Pulmonary/Chest: Effort normal. No respiratory distress.  Abdominal: Soft. He exhibits no distension.  Musculoskeletal:       Right shoulder: He exhibits deformity (with apparent anterior dislocation of glenohumeral joint).  Neurological: He is alert and oriented to person, place, and time.  Skin: Skin is warm and dry.  Psychiatric: He has a normal mood and affect.  Vitals reviewed.   ED Course  Reduction of dislocation Date/Time: 07/26/2015 1:25 AM Performed by: Lyndal Pulley Authorized by: Lyndal Pulley Consent: Verbal consent obtained. Consent given by: patient Patient understanding: patient states understanding of the procedure being performed Required items: required blood products, implants, devices, and special equipment available Patient identity confirmed: verbally with patient, arm band, provided demographic data and hospital-assigned identification number Time out: Immediately prior to procedure a "time out" was called to verify the correct patient, procedure, equipment, support staff and site/side marked as required. Local anesthesia used: yes Anesthesia method: intra-articular block. Local anesthetic: lidocaine 1% without epinephrine Anesthetic total: 15 ml Patient sedated: yes Sedation type: anxiolysis Sedatives: fentanyl Vitals: Vital signs were monitored during sedation. Patient tolerance: Patient tolerated the procedure well with no immediate complications Comments: Right shoulder dislocation reduction with traction-countertraction and elevation of arm above head   (including critical care time) Labs Review Labs Reviewed - No data to display  Imaging Review Dg Shoulder Right  07/25/2015  CLINICAL DATA:  Right shoulder pain after fall. Right shoulder dislocation. EXAM: RIGHT SHOULDER - 2+  VIEW COMPARISON:  06/08/2015 FINDINGS: Anterior inferior dislocation of the humerus  with respect to the glenoid. Moderate Hill-Sachs impaction injury is similar to prior exam. Acromioclavicular joint is congruent. IMPRESSION: Anterior glenohumeral shoulder dislocation. Electronically Signed   By: Rubye OaksMelanie  Ehinger M.D.   On: 07/25/2015 18:09   I have personally reviewed and evaluated these images and lab results as part of my medical decision-making.   EKG Interpretation None      MDM   Final diagnoses:  Shoulder dislocation, right, initial encounter   35 y.o. male presents with recurrent right shoulder dislocation. Procedure performed as above with intra-articular block and a dose of fentanyl. ROM improved and NVI with no axillary nerve deficits post-procedure. Placed in sling and recommended ortho f/u as this has been happening more frequently.     Lyndal Pulleyaniel Teona Vargus, MD 07/26/15 0130

## 2015-07-25 NOTE — ED Notes (Signed)
This RN and Rea CollegeJessica RN in the room with Clydene PughKnott MD for right shoulder relocation.  Pulses present post realignment, sensation WNL.  Patient A&Ox4

## 2015-08-08 ENCOUNTER — Emergency Department (HOSPITAL_COMMUNITY): Payer: Self-pay

## 2015-08-08 ENCOUNTER — Encounter (HOSPITAL_COMMUNITY): Payer: Self-pay | Admitting: Emergency Medicine

## 2015-08-08 ENCOUNTER — Emergency Department (HOSPITAL_COMMUNITY)
Admission: EM | Admit: 2015-08-08 | Discharge: 2015-08-09 | Disposition: A | Discharge status: Home / Self Care | Payer: Self-pay | Attending: Emergency Medicine | Admitting: Emergency Medicine

## 2015-08-08 DIAGNOSIS — X58XXXA Exposure to other specified factors, initial encounter: Secondary | ICD-10-CM | POA: Insufficient documentation

## 2015-08-08 DIAGNOSIS — S43014A Anterior dislocation of right humerus, initial encounter: Secondary | ICD-10-CM | POA: Insufficient documentation

## 2015-08-08 DIAGNOSIS — Y939 Activity, unspecified: Secondary | ICD-10-CM | POA: Insufficient documentation

## 2015-08-08 DIAGNOSIS — Z79899 Other long term (current) drug therapy: Secondary | ICD-10-CM | POA: Insufficient documentation

## 2015-08-08 DIAGNOSIS — F17200 Nicotine dependence, unspecified, uncomplicated: Secondary | ICD-10-CM | POA: Insufficient documentation

## 2015-08-08 DIAGNOSIS — S43004A Unspecified dislocation of right shoulder joint, initial encounter: Secondary | ICD-10-CM

## 2015-08-08 DIAGNOSIS — Y999 Unspecified external cause status: Secondary | ICD-10-CM | POA: Insufficient documentation

## 2015-08-08 DIAGNOSIS — Y929 Unspecified place or not applicable: Secondary | ICD-10-CM | POA: Insufficient documentation

## 2015-08-08 MED ORDER — MIDAZOLAM HCL 2 MG/2ML IJ SOLN
1.0000 mg | Freq: Once | INTRAMUSCULAR | Status: AC
  Administered 2015-08-08: 1 mg via INTRAVENOUS
  Filled 2015-08-08: qty 2

## 2015-08-08 MED ORDER — ETOMIDATE 2 MG/ML IV SOLN
10.0000 mg | Freq: Once | INTRAVENOUS | Status: AC
  Administered 2015-08-08: 10 mg via INTRAVENOUS
  Filled 2015-08-08: qty 10

## 2015-08-08 MED ORDER — OXYCODONE-ACETAMINOPHEN 5-325 MG PO TABS
ORAL_TABLET | ORAL | Status: AC
  Filled 2015-08-08: qty 1

## 2015-08-08 MED ORDER — OXYCODONE-ACETAMINOPHEN 5-325 MG PO TABS
1.0000 | ORAL_TABLET | ORAL | Status: DC | PRN
  Administered 2015-08-08: 1 via ORAL

## 2015-08-08 NOTE — ED Provider Notes (Signed)
CSN: [id number]     Arrival date & time 08/08/15  2059 History   First MD Initiated Contact with Patient 08/08/15 2213     Chief Complaint  Patient presents with  . Dislocation     (Consider location/radiation/quality/duration/timing/severity/associated sxs/prior Treatment) Patient is a 35 y.o. male presenting with shoulder injury. The history is provided by the patient.  Shoulder Injury This is a recurrent problem. The current episode started today. The problem occurs constantly. The problem has been unchanged. Pertinent negatives include no chest pain, diaphoresis, fever, numbness or vomiting. Nothing aggravates the symptoms. He has tried nothing for the symptoms.    Patient seen here in the ED on 07/25/15 for shoulder dislocation. He says any awkward or vigorous movement will cause shoulder to dislocate. Reports that most of the time he is able to reduce it himself but he was concerned this time that the dislocation was different "in the back as opposed to falling down" so he did not want to try to reduce it himself.   History reviewed. No pertinent past medical history. History reviewed. No pertinent past surgical history. No family history on file. Social History  Substance Use Topics  . Smoking status: Current Every Day Smoker  . Smokeless tobacco: None  . Alcohol Use: Yes    Review of Systems  Constitutional: Negative for fever and diaphoresis.  Cardiovascular: Negative for chest pain.  Gastrointestinal: Negative for vomiting.  Neurological: Negative for numbness.    Allergies  Review of patient's allergies indicates no known allergies.  Home Medications   Prior to Admission medications   Medication Sig Start Date End Date Taking? Authorizing Provider  ibuprofen (ADVIL,MOTRIN) 600 MG tablet Take 600 mg by mouth every 6 (six) hours as needed for moderate pain.   Yes Historical Provider, MD  cyclobenzaprine (FLEXERIL) 10 MG tablet Take 1 tablet (10 mg total) by mouth 2  (two) times daily as needed for muscle spasms. 06/08/15   Alvira MondayErin Schlossman, MD  ibuprofen (ADVIL,MOTRIN) 600 MG tablet Take 1 tablet (600 mg total) by mouth every 6 (six) hours as needed. 06/08/15   Alvira MondayErin Schlossman, MD   BP 152/105 mmHg  Pulse 94  Temp(Src) 98.1 F (36.7 C) (Oral)  Resp 28  Ht 5\' 7"  (1.702 m)  Wt 71.838 kg  BMI 24.80 kg/m2  SpO2 100% Physical Exam  Constitutional: He appears well-developed and well-nourished. No distress.  HENT:  Head: Normocephalic and atraumatic.  Eyes: Pupils are equal, round, and reactive to light.  Neck: Normal range of motion. Neck supple.  Cardiovascular: Normal rate and regular rhythm.   Pulmonary/Chest: Effort normal.  Abdominal: Soft.  Musculoskeletal:       Right shoulder: He exhibits decreased range of motion, tenderness, bony tenderness, deformity, pain and spasm. He exhibits no swelling, no effusion, no crepitus, no laceration and normal pulse.  Shoulder dislocation (right)  Neurological: He is alert.  Skin: Skin is warm and dry.  Nursing note and vitals reviewed.   ED Course  Reduction of dislocation Date/Time: 08/09/2015 12:00 AM Performed by: Marlon PelGREENE, Chaneka Trefz Authorized by: Marlon PelGREENE, Raywood Wailes Consent: Verbal consent obtained. Risks and benefits: risks, benefits and alternatives were discussed Consent given by: patient Patient understanding: patient states understanding of the procedure being performed Patient consent: the patient's understanding of the procedure matches consent given Imaging studies: imaging studies available Patient identity confirmed: verbally with patient and arm band Time out: Immediately prior to procedure a "time out" was called to verify the correct patient, procedure, equipment, support staff  and site/side marked as required. Patient sedated: yes Patient tolerance: Patient tolerated the procedure well with no immediate complications Comments: Successful reduction of right shoulder   (including critical care  time) Labs Review Labs Reviewed - No data to display  Imaging Review Dg Shoulder Right  08/08/2015  CLINICAL DATA:  Right shoulder dislocation. EXAM: RIGHT SHOULDER - 2+ VIEW COMPARISON:  07/25/2015 FINDINGS: Examination demonstrates anterior shoulder dislocation without significant change from the prior exam. No definite fracture. IMPRESSION: Anterior shoulder dislocation. Electronically Signed   By: Elberta Fortisaniel  Boyle M.D.   On: 08/08/2015 22:02   I have personally reviewed and evaluated these images and lab results as part of my medical decision-making.   EKG Interpretation None      MDM   Final diagnoses:  Shoulder dislocation, right, initial encounter   Offered patient intraarticular block with a shot of pain medication but he is refusing. He is requesting conscious sedation for reduction and understands associated risks.  Discussed case with Dr. Particia NearingHaviland who will reduce shoulder under moderate sedation. Successful reduction of shoulder.  Discussed with patient the need to see orthopedic specialist or his shoulder will continue to dislocate. He already has sling on arm. Advised to continue to use sling until he see's ortho.     Marlon Peliffany Onie Kasparek, PA-C 08/09/15 0015  Jacalyn LefevreJulie Haviland, MD 08/09/15 2123

## 2015-08-08 NOTE — ED Notes (Signed)
Pt. dislocated his right shoulder while on bed this evening , denies injury or fall .

## 2015-08-09 NOTE — ED Provider Notes (Signed)
CSN: [id number]     Arrival date & time 08/08/15  2059 History   First MD Initiated Contact with Patient 08/08/15 2213     Chief Complaint  Patient presents with  . Dislocation     (Consider location/radiation/quality/duration/timing/severity/associated sxs/prior Treatment) HPI  History reviewed. No pertinent past medical history. History reviewed. No pertinent past surgical history. No family history on file. Social History  Substance Use Topics  . Smoking status: Current Every Day Smoker  . Smokeless tobacco: None  . Alcohol Use: Yes    Review of Systems    Allergies  Review of patient's allergies indicates no known allergies.  Home Medications   Prior to Admission medications   Medication Sig Start Date End Date Taking? Authorizing Provider  ibuprofen (ADVIL,MOTRIN) 600 MG tablet Take 600 mg by mouth every 6 (six) hours as needed for moderate pain.   Yes Historical Provider, MD  cyclobenzaprine (FLEXERIL) 10 MG tablet Take 1 tablet (10 mg total) by mouth 2 (two) times daily as needed for muscle spasms. 06/08/15   Alvira MondayErin Schlossman, MD  ibuprofen (ADVIL,MOTRIN) 600 MG tablet Take 1 tablet (600 mg total) by mouth every 6 (six) hours as needed. 06/08/15   Alvira MondayErin Schlossman, MD   BP 152/105 mmHg  Pulse 94  Temp(Src) 98.1 F (36.7 C) (Oral)  Resp 28  Ht 5\' 7"  (1.702 m)  Wt 158 lb 6 oz (71.838 kg)  BMI 24.80 kg/m2  SpO2 100% Physical Exam  ED Course  .Sedation Date/Time: 08/09/2015 12:05 AM Performed by: Jacalyn LefevreHAVILAND, Loran Fleet Authorized by: Jacalyn LefevreHAVILAND, Karlei Waldo  Consent:    Consent obtained:  Written   Consent given by:  Patient   Risks discussed:  Allergic reaction, prolonged hypoxia resulting in organ damage, dysrhythmia, prolonged sedation necessitating reversal, inadequate sedation, respiratory compromise necessitating ventilatory assistance and intubation, nausea and vomiting   Alternatives discussed:  Analgesia without sedation, anxiolysis and regional anesthesia Universal  protocol:    Procedure explained and questions answered to patient or proxy's satisfaction: yes     Relevant documents present and verified: yes     Test results available and properly labeled: yes     Imaging studies available: yes     Required blood products, implants, devices, and special equipment available: yes     Site/side marked: yes     Immediately prior to procedure a time out was called: yes     Patient identity confirmation method:  Verbally with patient Indications:    Sedation purpose:  Dislocation reduction   Procedure necessitating sedation performed by:  Different physician   Intended level of sedation:  Moderate (conscious sedation) Pre-sedation assessment:    NPO status caution: urgency dictates proceeding with non-ideal NPO status     Neck mobility: normal     Pre-sedation assessments completed and reviewed: airway patency, cardiovascular function, hydration status, mental status, nausea/vomiting, pain level, respiratory function and temperature   Immediate pre-procedure details:    Reassessment: Patient reassessed immediately prior to procedure     Reviewed: vital signs, relevant labs/tests and NPO status     Verified: bag valve mask available, emergency equipment available, intubation equipment available, IV patency confirmed, oxygen available, reversal medications available and suction available   Procedure details (see MAR for exact dosages):    Preoxygenation:  Room air   Sedation:  Etomidate and midazolam   Intra-procedure monitoring:  Blood pressure monitoring, cardiac monitor, continuous capnometry, continuous pulse oximetry, frequent vital sign checks and frequent LOC assessments   Intra-procedure events: none  Post-procedure details:    Attendance: Constant attendance by certified staff until patient recovered     Recovery: Patient returned to pre-procedure baseline     Post-sedation assessments completed and reviewed: airway patency, cardiovascular  function, hydration status, mental status, nausea/vomiting, pain level, respiratory function and temperature     Patient is stable for discharge or admission: Yes     Patient tolerance:  Tolerated well, no immediate complications Comments:     PLEASE SEE NURSE'S NOTES FOR TIMES OF MEDS AND PROCEDURE.  (including critical care time) Labs Review Labs Reviewed - No data to display  Imaging Review Dg Shoulder Right  08/08/2015  CLINICAL DATA:  Right shoulder dislocation. EXAM: RIGHT SHOULDER - 2+ VIEW COMPARISON:  07/25/2015 FINDINGS: Examination demonstrates anterior shoulder dislocation without significant change from the prior exam. No definite fracture. IMPRESSION: Anterior shoulder dislocation. Electronically Signed   By: Elberta Fortisaniel  Boyle M.D.   On: 08/08/2015 22:02   I have personally reviewed and evaluated these images and lab results as part of my medical decision-making.   EKG Interpretation None      MDM   Final diagnoses:  Shoulder dislocation, right, initial encounter      Jacalyn LefevreJulie Aniko Finnigan, MD 08/09/15 [phone]

## 2015-08-09 NOTE — Discharge Instructions (Signed)
Shoulder Dislocation A shoulder dislocation happens when the upper arm bone (humerus) moves out of the shoulder joint. The shoulder joint is the part of the shoulder where the humerus, shoulder blade (scapula), and collarbone (clavicle) meet. CAUSES This condition is often caused by:  A fall.  A hit to the shoulder.  A forceful movement of the shoulder. RISK FACTORS This condition is more likely to develop in people who play sports. SYMPTOMS Symptoms of this condition include:  Deformity of the shoulder.  Intense pain.  Inability to move the shoulder.  Numbness, weakness, or tingling in your neck or down your arm.  Bruising or swelling around your shoulder. DIAGNOSIS This condition is diagnosed with a physical exam. After the exam, tests may be done to check for related problems. Tests that may be done include:  X-ray. This may be done to check for broken bones.  MRI. This may be done to check for damage to the tissues around the shoulder.  Electromyogram. This may be done to check for nerve damage. TREATMENT This condition is treated with a procedure to place the humerus back in the joint. This procedure is called a reduction. There are two types of reduction:  Closed reduction. In this procedure, the humerus is placed back in the joint without surgery. The health care provider uses his or her hands to guide the bone back into place.  Open reduction. In this procedure, the humerus is placed back in the joint with surgery. An open reduction may be recommended if:  You have a weak shoulder joint or weak ligaments.  You have had more than one shoulder dislocation.  The nerves or blood vessels around your shoulder have been damaged. After the humerus is placed back into the joint, your arm will be placed in a splint or sling to prevent it from moving. You will need to wear the splint or sling until your shoulder heals. When the splint or sling is removed, you may have  physical therapy to help improve the range of motion in your shoulder joint. HOME CARE INSTRUCTIONS If You Have a Splint or Sling:  Wear it as told by your health care provider. Remove it only as told by your health care provider.  Loosen it if your fingers become numb and tingle, or if they turn cold and blue.  Keep it clean and dry. Bathing  Do not take baths, swim, or use a hot tub until your health care provider approves. Ask your health care provider if you can take showers. You may only be allowed to take sponge baths for bathing.  If your health care provider approves bathing and showering, cover your splint or sling with a watertight plastic bag to protect it from water. Do not let the splint or sling get wet. Managing Pain, Stiffness, and Swelling  If directed, apply ice to the injured area.  Put ice in a plastic bag.  Place a towel between your skin and the bag.  Leave the ice on for 20 minutes, 2-3 times per day.  Move your fingers often to avoid stiffness and to decrease swelling.  Raise (elevate) the injured area above the level of your heart while you are sitting or lying down. Driving  Do not drive while wearing a splint or sling on a hand that you use for driving.  Do not drive or operate heavy machinery while taking pain medicine. Activity  Return to your normal activities as told by your health care provider. Ask your  health care provider what activities are safe for you.  Perform range-of-motion exercises only as told by your health care provider.  Exercise your hand by squeezing a soft ball. This helps to decrease stiffness and swelling in your hand and wrist. General Instructions  Take over-the-counter and prescription medicines only as told by your health care provider.  Do not use any tobacco products, including cigarettes, chewing tobacco, or e-cigarettes. Tobacco can delay bone and tissue healing. If you need help quitting, ask your health care  provider.  Keep all follow-up visits as told by your health care provider. This is important. SEEK MEDICAL CARE IF:  Your splint or sling gets damaged. SEEK IMMEDIATE MEDICAL CARE IF:  Your pain gets worse rather than better.  You lose feeling in your arm or hand.  Your arm or hand becomes white and cold.   This information is not intended to replace advice given to you by your health care provider. Make sure you discuss any questions you have with your health care provider.   Document Released: 10/19/2000 Document Revised: 10/15/2014 Document Reviewed: 05/19/2014 Elsevier Interactive Patient Education 2016 [employer]  Shoulder Dislocation Your shoulder joint is made up of 3 bones:  The upper arm bone (humerus).  The shoulder blade (scapula).  The collarbone (clavicle). A shoulder dislocation happens when your upper arm bone moves out of its normal place in your shoulder joint. HOME CARE If You Have a Splint or Sling:  Wear it as told by your doctor.  Take it off only as told by your doctor.  Loosen it if:  Your fingers become numb and tingly.  Your fingers turn cold and blue.  Keep it clean and dry. Bathing  Do not take baths, swim, or use a hot tub until your doctor says you can. Ask your doctor if you can take showers. You may only be allowed to take sponge baths.  If your doctor says taking baths or showers is okay, cover your splint or sling with a plastic bag. Do not let the splint or sling get wet. Managing Pain, Stiffness, and Swelling  If told, put ice on the injured area.  Put ice in a plastic bag.  Place a towel between your skin and the bag.  Leave the ice on for 20 minutes, 2-3 times per day.  Move your fingers often to avoid stiffness and to lessen swelling.  Raise (elevate) the injured area above the level of your heart while you are sitting or lying down. Driving  Do not drive while you are wearing a splint or sling on a hand that you  use for driving.  Do not drive or operate heavy machinery while taking pain medicine. Activity  Return to your normal activities as told by your doctor. Ask your doctor what activities are safe for you.  Do range-of-motion exercises only as told by your doctor.  Exercise your hand by squeezing a soft ball. This keeps your hand and wrist from getting stiff and swollen. General Instructions  Take over-the-counter and prescription medicines only as told by your doctor.  Do not use any tobacco products, including cigarettes, chewing tobacco, or e-cigarettes. Tobacco can slow down healing. If you need help quitting, ask your doctor.  Keep all follow-up visits as told by your doctor. This is important. GET HELP IF:  Your splint or sling gets damaged. GET HELP RIGHT AWAY IF:  Your pain gets worse instead of better.  You lose feeling in your arm or hand.  Your arm or hand turns white and cold.   This information is not intended to replace advice given to you by your health care provider. Make sure you discuss any questions you have with your health care provider.   Document Released: 04/18/2011 Document Revised: 10/15/2014 Document Reviewed: 05/19/2014 Elsevier Interactive Patient Education 2016 [employer].  How to Use a Shoulder Immobilizer A shoulder immobilizer is a device that you may have to wear after a shoulder injury or surgery. This device keeps your arm from moving. This prevents additional pain or injury. It also supports your arm next to your body as your shoulder heals. You may need to wear a shoulder immobilizer to treat a broken bone (fracture) in your shoulder. You may also need to wear one if you have an injury that moves your shoulder out of position (dislocation). There are different types of shoulder immobilizers. The one that you get depends on your injury. RISKS AND COMPLICATIONS Wearing a shoulder immobilizer in the wrong way can let your injured shoulder move  around too much. This may delay healing and make your pain and swelling worse. HOW TO USE YOUR SHOULDER IMMOBILIZER  The part of the immobilizer that goes around your neck (sling) should support your upper arm, with your elbow bent and your lower arm and hand across your chest.  Make sure that your elbow:  Is snug against the back pocket of the sling.  Does not move away from your body.  The strap of the immobilizer should go over your shoulder and support your arm and hand. Your hand should be slightly higher than your elbow. It should not hang loosely over the edge of the sling.  If the long strap has a pad, place it where it is most comfortable on your neck.  Carefully follow your health care provider's instructions for wearing your shoulder immobilizer. Your health care provider may want you to:  Loosen your immobilizer to straighten your elbow and move your wrist and fingers. You may have to do this several times each day. Ask your health care provider when you should do this and how often.  Remove your immobilizer once every day to shower, but limit the movement in your injured arm. Before putting the immobilizer back on, use a towel to dry the area under your arm completely.  Remove your immobilizer to do shoulder exercises at home as directed by your health care provider.  Wear your immobilizer while you sleep. You may sleep more comfortably if you have your upper body raised on pillows. SEEK MEDICAL CARE IF:  Your immobilizer is not supporting your arm properly.  Your immobilizer gets damaged.  You have worsening pain or swelling in your shoulder, arm, or hand.  Your shoulder, arm, or hand changes color or temperature.  You lose feeling in your shoulder, arm, or hand.   This information is not intended to replace advice given to you by your health care provider. Make sure you discuss any questions you have with your health care provider.   Document Released: 03/03/2004  Document Revised: 06/10/2014 Document Reviewed: 01/01/2014 Elsevier Interactive Patient Education [employer]

## 2016-09-10 IMAGING — CR DG SHOULDER 2+V PORT*R*
2 series · 2 of 2 positions shown · non-contrast
Comparison: Earlier films, same date.

CLINICAL DATA: Reduction of shoulder dislocation.

EXAM:
PORTABLE RIGHT SHOULDER - 2+ VIEW

[AP (1 of 2)]
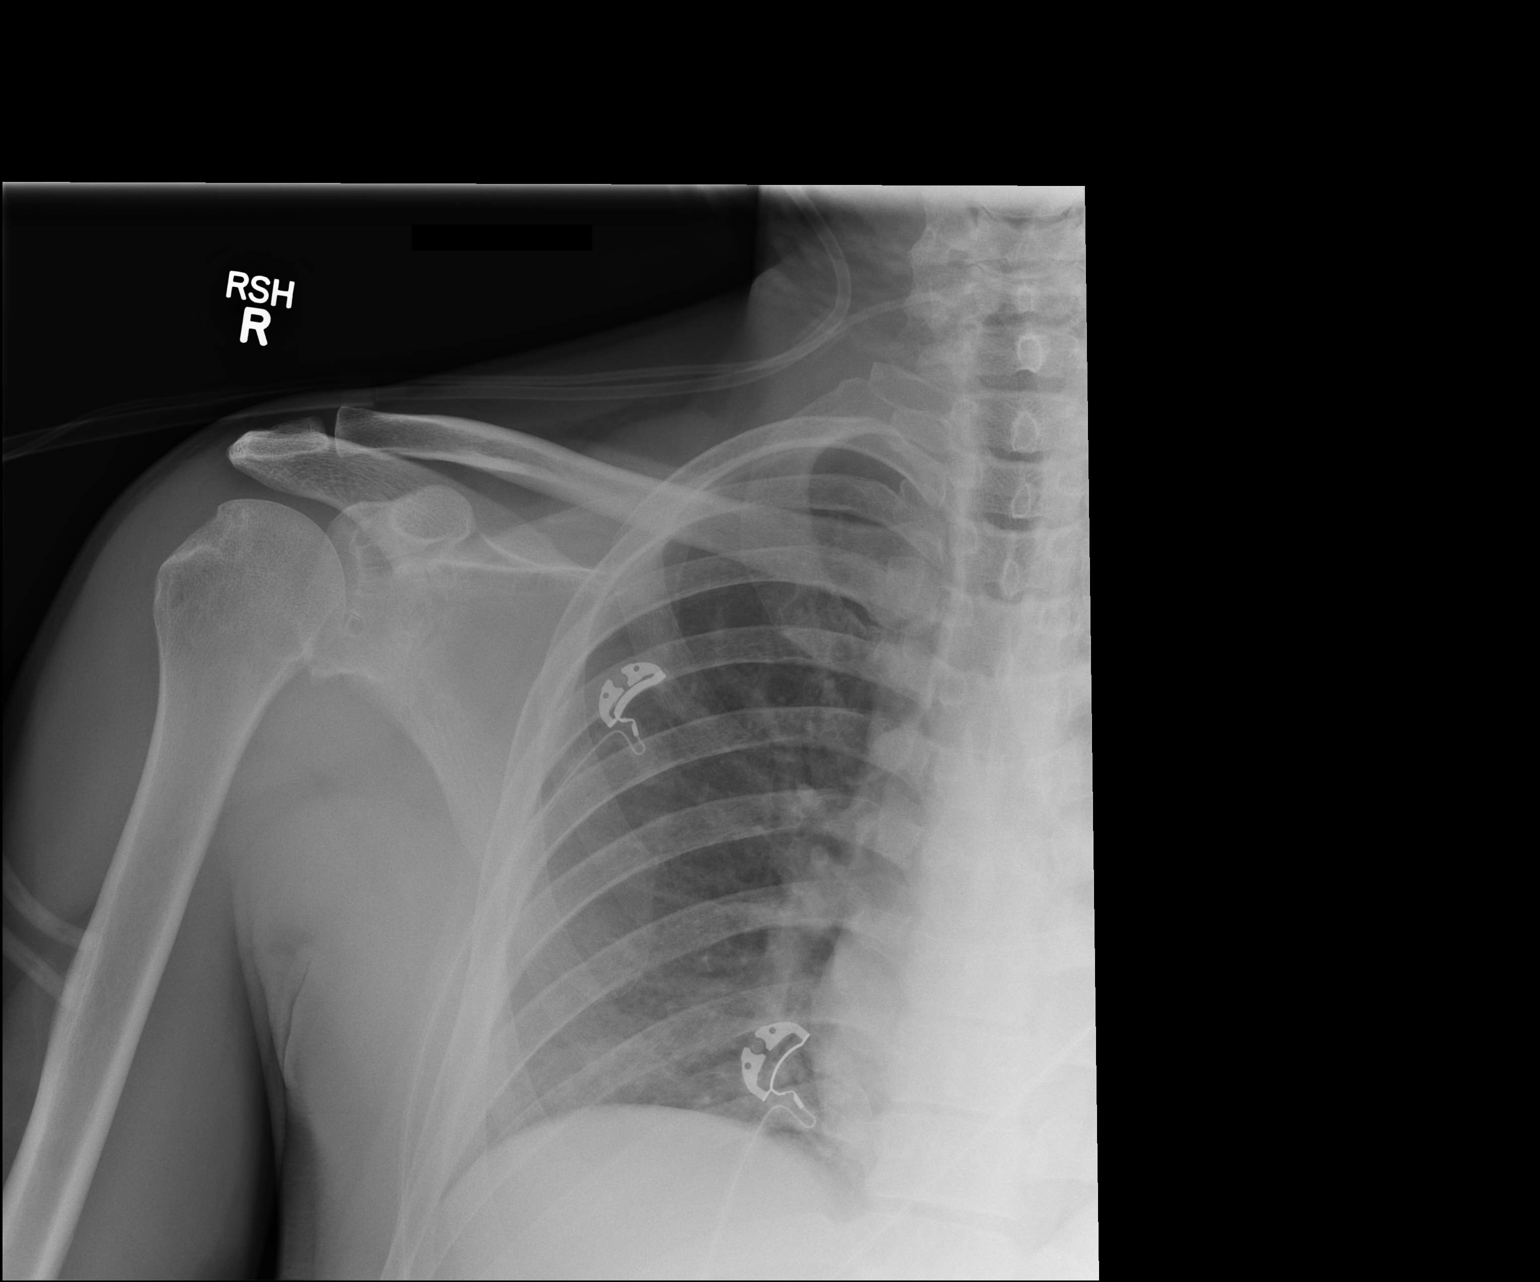

[AP (2 of 2)]
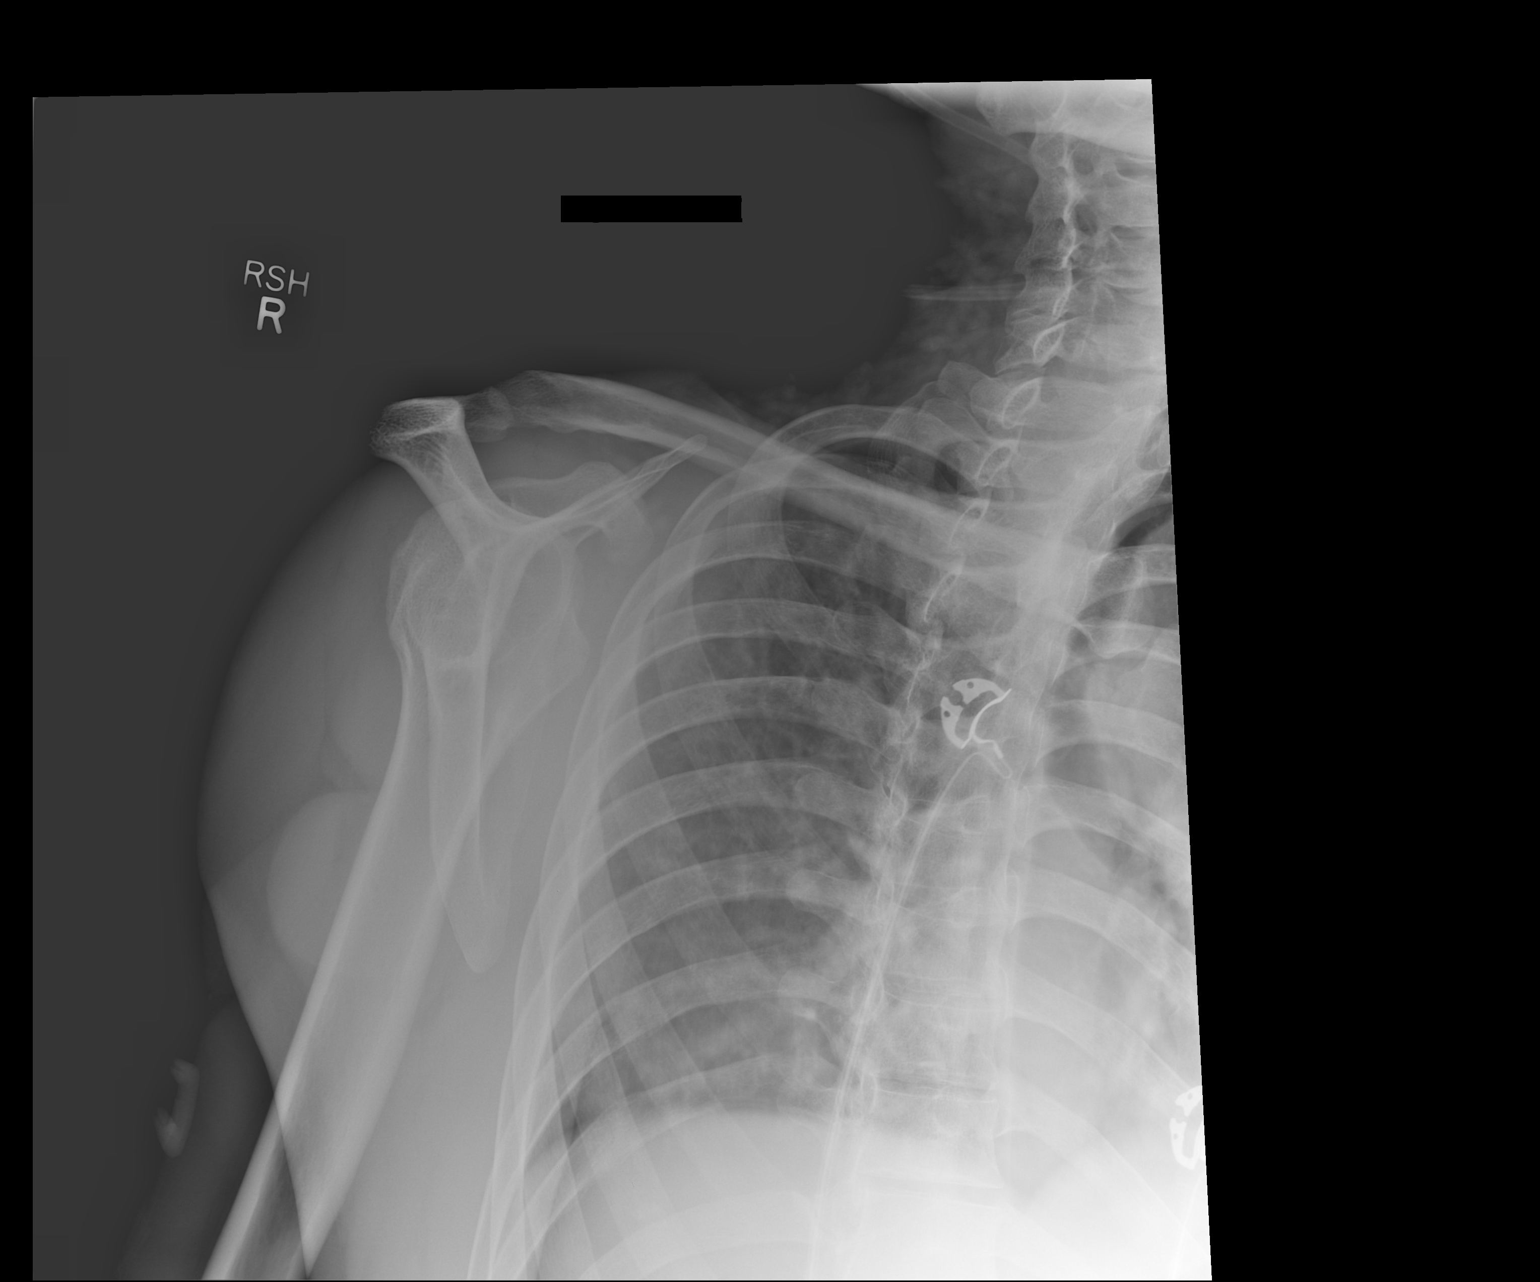

[2 of 2 positions shown; findings below may reference images not displayed]

FINDINGS: The humeral head has been relocated in the glenoid fossa. No acute
fractures identified. Advanced glenohumeral joint degenerative
changes for age.
IMPRESSION: Reduction of right shoulder dislocation.

Age advanced glenohumeral joint degenerative changes.

## 2016-11-10 IMAGING — DX DG SHOULDER 2+V*R*
3 series · 3 of 3 positions shown · non-contrast
Comparison: 07/25/2015

CLINICAL DATA: Right shoulder dislocation.

EXAM:
RIGHT SHOULDER - 2+ VIEW

[shoulder grashey]
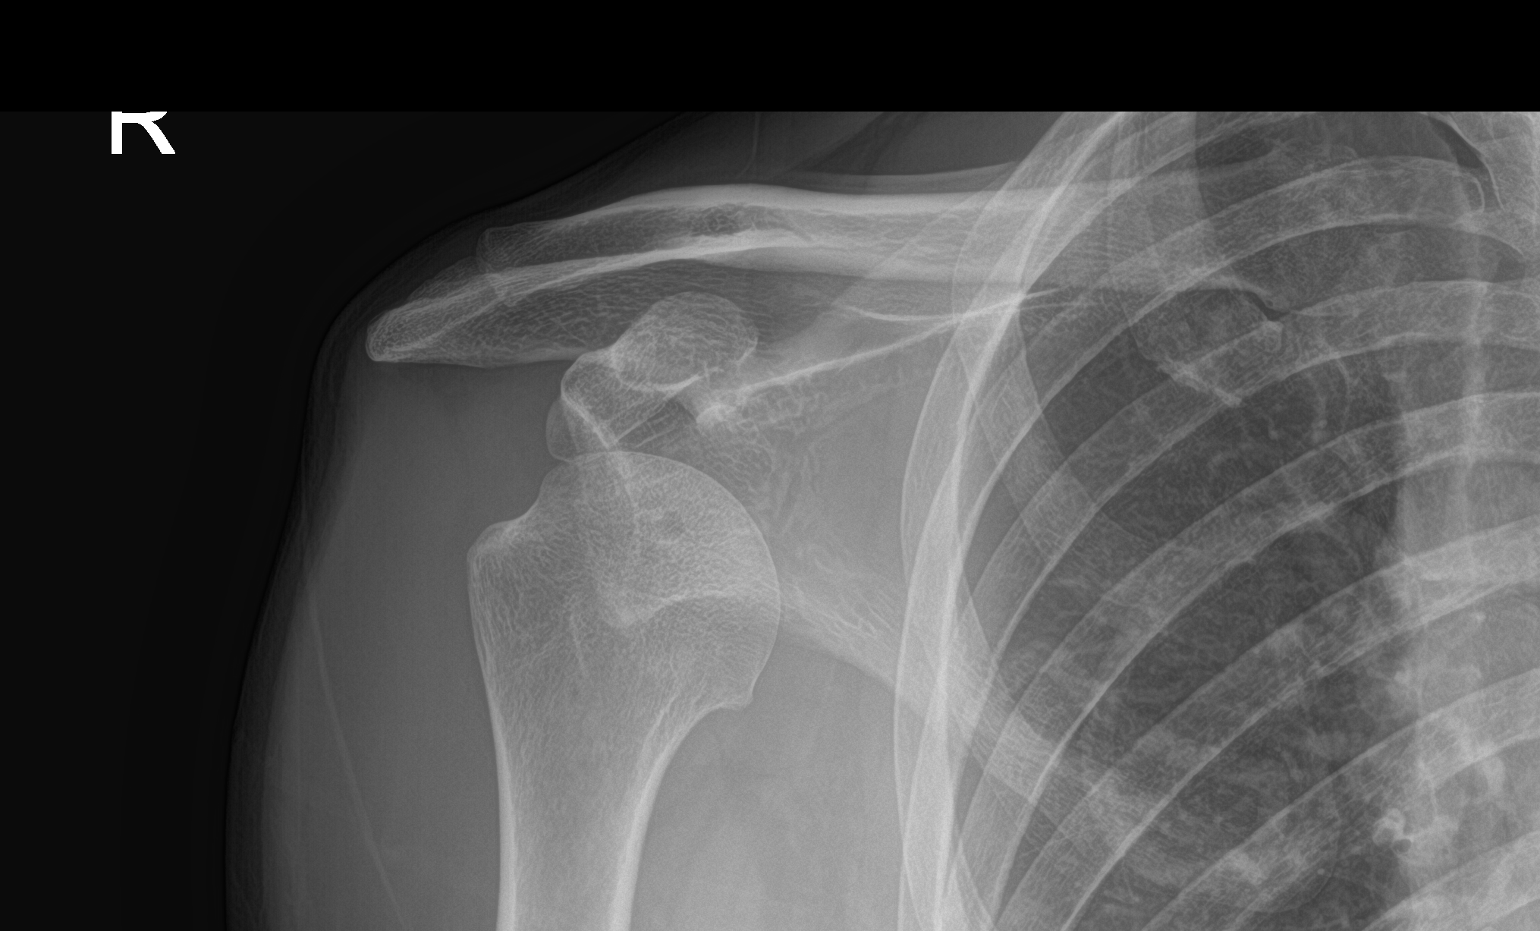

[shoulder y view]
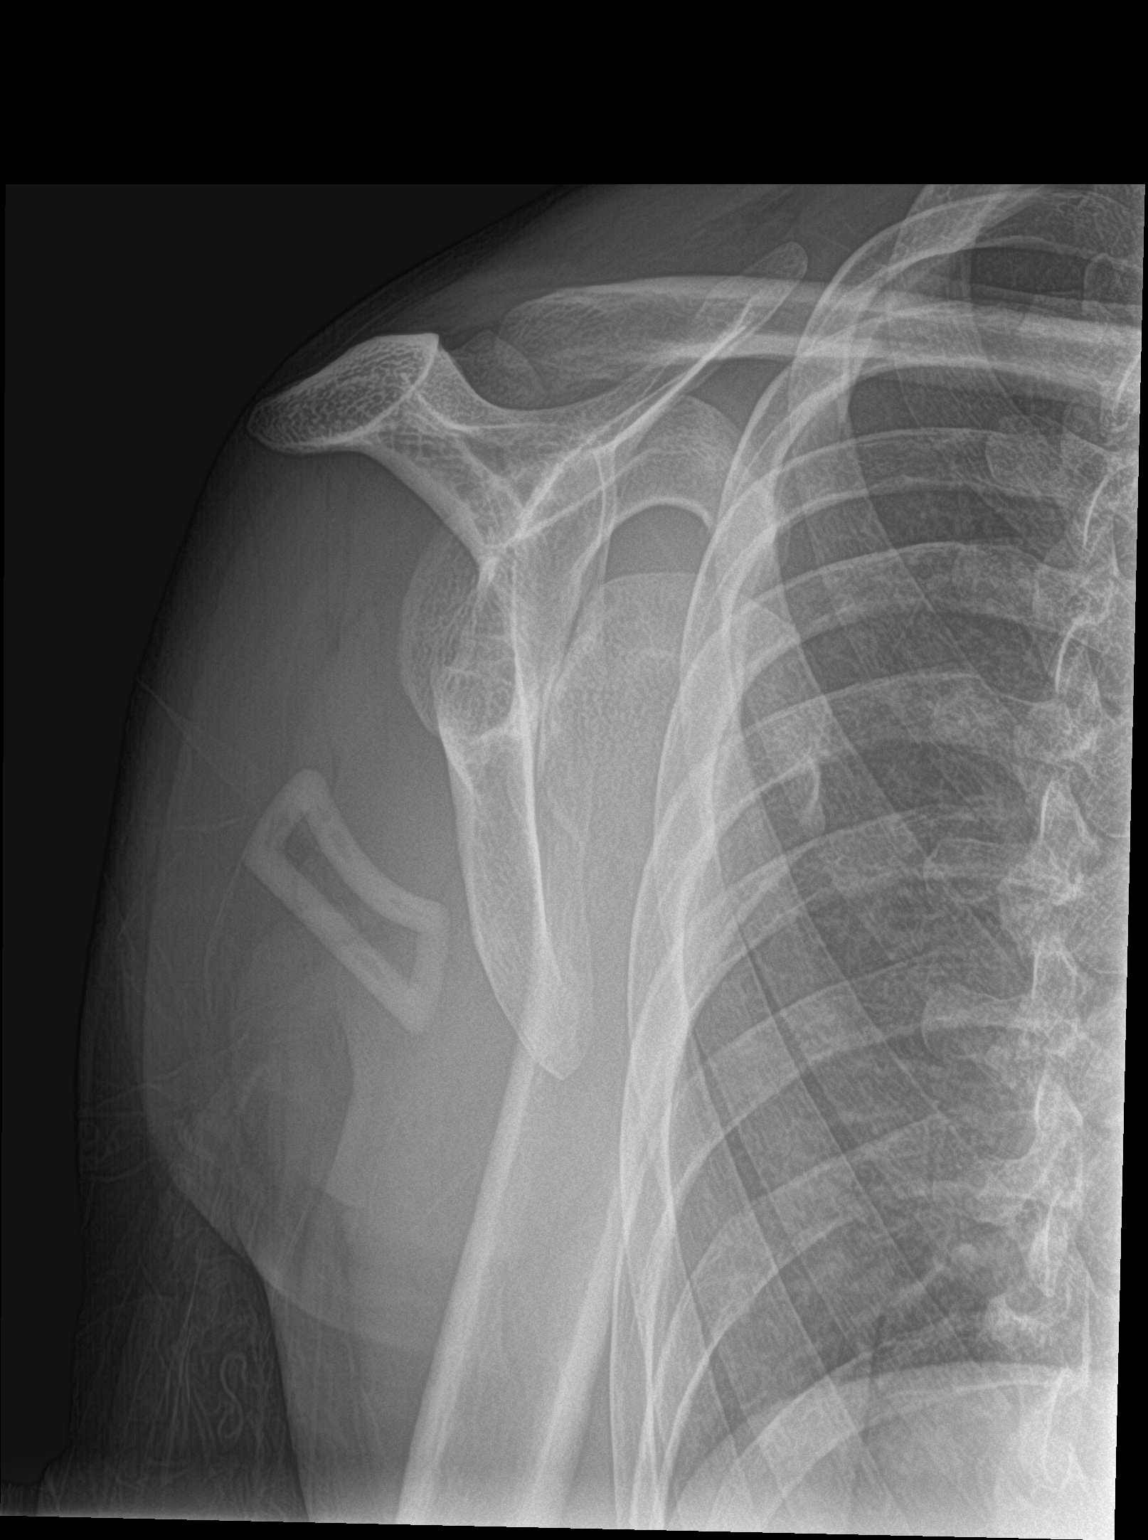

[shoulder ap neutral]
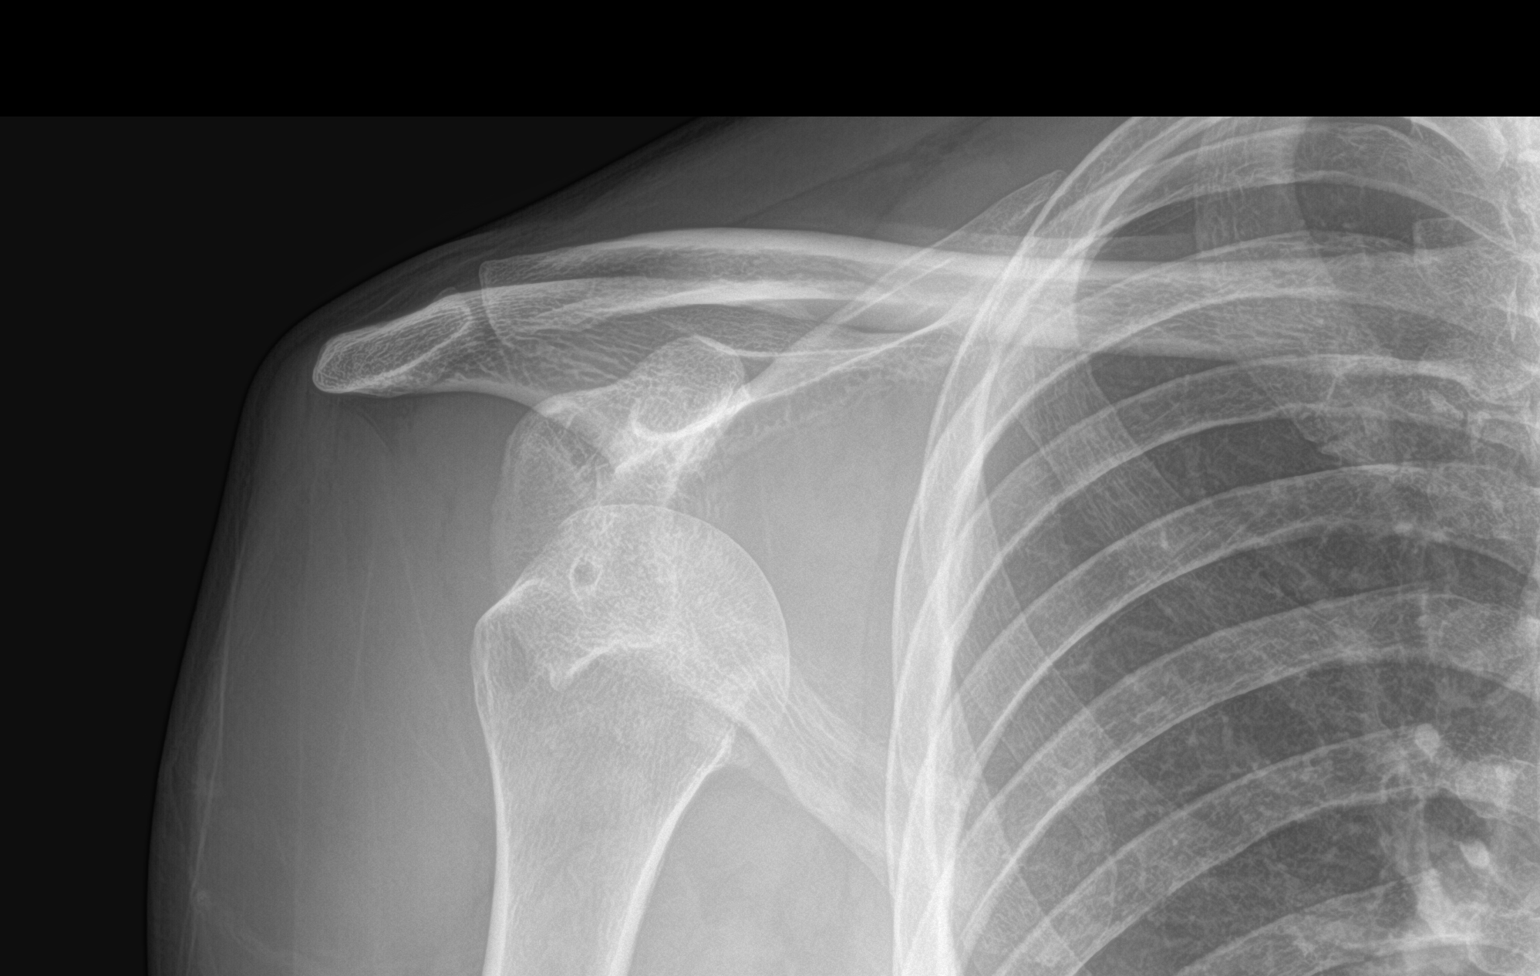

[3 of 3 positions shown; findings below may reference images not displayed]

FINDINGS: Examination demonstrates anterior shoulder dislocation without
significant change from the prior exam. No definite fracture.
IMPRESSION: Anterior shoulder dislocation.

## 2016-11-10 IMAGING — CR DG SHOULDER 2+V PORT*R*
1 series · 1 of 1 positions shown · non-contrast
Comparison: Radiograph dated 08/08/2015

CLINICAL DATA: 35-year-old male status post reduction of the right
shoulder dislocation.

EXAM:
PORTABLE RIGHT SHOULDER - 2+ VIEW

[AP]
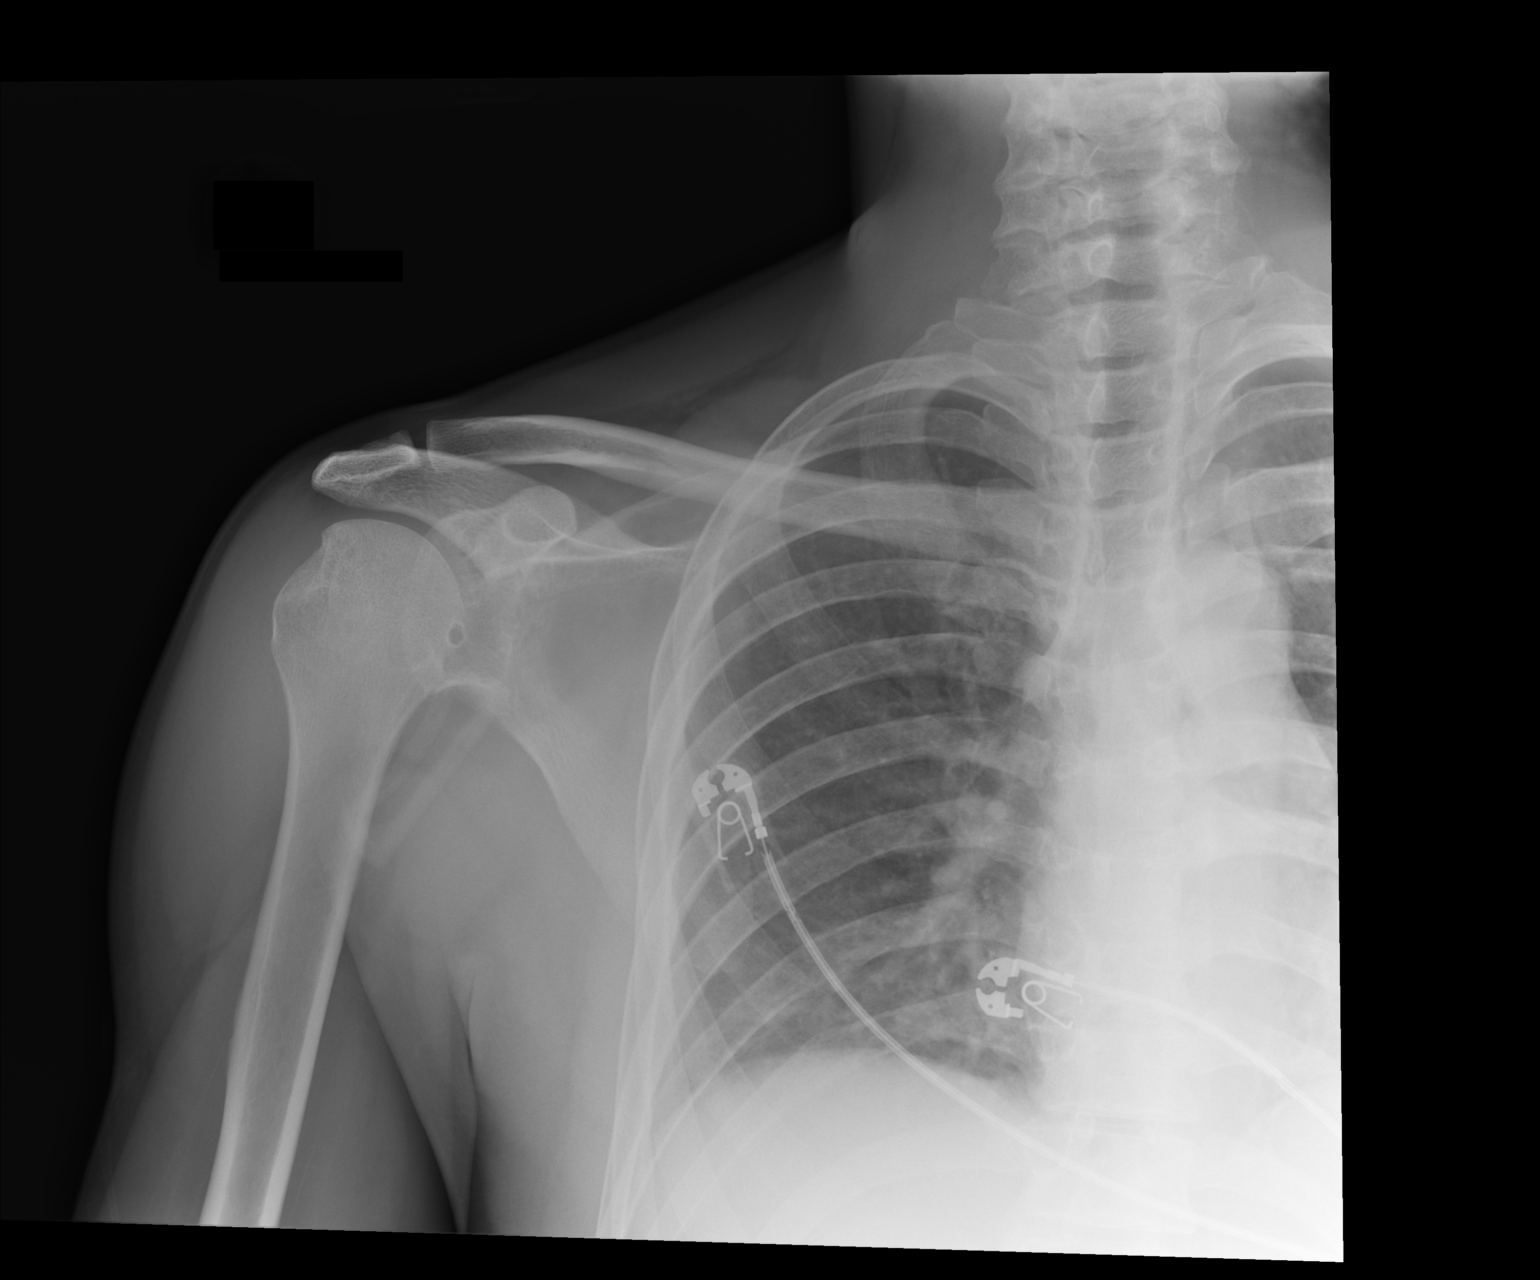

[1 of 1 positions shown; findings below may reference images not displayed]

FINDINGS: There has been interval reduction of the previously seen right
shoulder dislocation. The humeral head appears in anatomic alignment
with the glenoid fossa on this single frontal projection. No
definite acute fracture identified. The soft tissues appear
unremarkable.
IMPRESSION: Interval reduction of the previously seen right shoulder dislocation
now in anatomic alignment.
